# Patient Record
Sex: Female | Born: 1990 | Race: White | Hispanic: No | Marital: Single | Smoking: Never smoker
Health system: Southern US, Community
[De-identification: ages and names within clinical notes are randomized; demographics above are authoritative.]

## PROBLEM LIST (undated history)

## (undated) DIAGNOSIS — J309 Allergic rhinitis, unspecified: Secondary | ICD-10-CM

## (undated) DIAGNOSIS — R0600 Dyspnea, unspecified: Secondary | ICD-10-CM

## (undated) HISTORY — PX: OTHER SURGICAL HISTORY: SHX169

## (undated) HISTORY — DX: Allergic rhinitis, unspecified: J30.9

## (undated) HISTORY — DX: Dyspnea, unspecified: R06.00

## (undated) HISTORY — PX: TONSILLECTOMY: SUR1361

---
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
## 2012-04-27 ENCOUNTER — Other Ambulatory Visit: Payer: Self-pay | Admitting: Obstetrics and Gynecology

## 2012-04-27 DIAGNOSIS — N63 Unspecified lump in unspecified breast: Secondary | ICD-10-CM

## 2012-04-29 ENCOUNTER — Ambulatory Visit
Admission: RE | Admit: 2012-04-29 | Discharge: 2012-04-29 | Disposition: A | Discharge status: Home / Self Care | Payer: BC Managed Care – PPO | Source: Ambulatory Visit | Attending: Obstetrics and Gynecology | Admitting: Obstetrics and Gynecology

## 2012-04-29 DIAGNOSIS — N63 Unspecified lump in unspecified breast: Secondary | ICD-10-CM

## 2012-09-11 ENCOUNTER — Other Ambulatory Visit: Payer: Self-pay | Admitting: Nurse Practitioner

## 2012-09-11 DIAGNOSIS — N63 Unspecified lump in unspecified breast: Secondary | ICD-10-CM

## 2012-09-22 ENCOUNTER — Other Ambulatory Visit: Payer: BC Managed Care – PPO

## 2012-09-25 ENCOUNTER — Ambulatory Visit
Admission: RE | Admit: 2012-09-25 | Discharge: 2012-09-25 | Disposition: A | Discharge status: Home / Self Care | Payer: BC Managed Care – PPO | Source: Ambulatory Visit | Attending: Nurse Practitioner | Admitting: Nurse Practitioner

## 2012-09-25 DIAGNOSIS — N63 Unspecified lump in unspecified breast: Secondary | ICD-10-CM

## 2013-05-04 ENCOUNTER — Other Ambulatory Visit (HOSPITAL_COMMUNITY)
Admission: RE | Admit: 2013-05-04 | Discharge: 2013-05-04 | Disposition: A | Discharge status: Home / Self Care | Payer: BC Managed Care – PPO | Source: Ambulatory Visit | Attending: Nurse Practitioner | Admitting: Nurse Practitioner

## 2013-05-04 ENCOUNTER — Other Ambulatory Visit: Payer: Self-pay | Admitting: Nurse Practitioner

## 2013-05-04 DIAGNOSIS — Z113 Encounter for screening for infections with a predominantly sexual mode of transmission: Secondary | ICD-10-CM | POA: Insufficient documentation

## 2013-05-04 DIAGNOSIS — Z01419 Encounter for gynecological examination (general) (routine) without abnormal findings: Secondary | ICD-10-CM | POA: Insufficient documentation

## 2013-09-28 ENCOUNTER — Other Ambulatory Visit: Payer: Self-pay | Admitting: Nurse Practitioner

## 2013-09-28 DIAGNOSIS — D242 Benign neoplasm of left breast: Secondary | ICD-10-CM

## 2013-10-07 ENCOUNTER — Ambulatory Visit
Admission: RE | Admit: 2013-10-07 | Discharge: 2013-10-07 | Disposition: A | Discharge status: Home / Self Care | Payer: BC Managed Care – PPO | Source: Ambulatory Visit | Attending: Nurse Practitioner | Admitting: Nurse Practitioner

## 2013-10-07 DIAGNOSIS — D242 Benign neoplasm of left breast: Secondary | ICD-10-CM

## 2013-10-19 ENCOUNTER — Other Ambulatory Visit: Payer: Self-pay | Admitting: Family Medicine

## 2013-10-19 ENCOUNTER — Ambulatory Visit
Admission: RE | Admit: 2013-10-19 | Discharge: 2013-10-19 | Disposition: A | Discharge status: Home / Self Care | Payer: BC Managed Care – PPO | Source: Ambulatory Visit | Attending: Family Medicine | Admitting: Family Medicine

## 2013-10-19 DIAGNOSIS — J069 Acute upper respiratory infection, unspecified: Secondary | ICD-10-CM

## 2016-06-06 ENCOUNTER — Other Ambulatory Visit (HOSPITAL_COMMUNITY)
Admission: RE | Admit: 2016-06-06 | Discharge: 2016-06-06 | Disposition: A | Discharge status: Home / Self Care | Payer: BLUE CROSS/BLUE SHIELD | Source: Ambulatory Visit | Attending: Nurse Practitioner | Admitting: Nurse Practitioner

## 2016-06-06 ENCOUNTER — Other Ambulatory Visit: Payer: Self-pay | Admitting: Nurse Practitioner

## 2016-06-06 DIAGNOSIS — Z124 Encounter for screening for malignant neoplasm of cervix: Secondary | ICD-10-CM | POA: Diagnosis not present

## 2016-06-07 LAB — CYTOLOGY - PAP
Chlamydia: NEGATIVE
Diagnosis: NEGATIVE
Neisseria Gonorrhea: NEGATIVE

## 2018-01-02 ENCOUNTER — Other Ambulatory Visit: Payer: Self-pay

## 2018-01-02 ENCOUNTER — Emergency Department (HOSPITAL_COMMUNITY): Payer: Managed Care, Other (non HMO)

## 2018-01-02 ENCOUNTER — Ambulatory Visit (HOSPITAL_COMMUNITY): Payer: Managed Care, Other (non HMO)

## 2018-01-02 ENCOUNTER — Emergency Department (HOSPITAL_COMMUNITY)
Admission: EM | Admit: 2018-01-02 | Discharge: 2018-01-02 | Disposition: A | Discharge status: Home / Self Care | Payer: Managed Care, Other (non HMO) | Attending: Emergency Medicine | Admitting: Emergency Medicine

## 2018-01-02 ENCOUNTER — Encounter (HOSPITAL_COMMUNITY): Payer: Self-pay | Admitting: *Deleted

## 2018-01-02 DIAGNOSIS — K529 Noninfective gastroenteritis and colitis, unspecified: Secondary | ICD-10-CM | POA: Insufficient documentation

## 2018-01-02 DIAGNOSIS — Z79899 Other long term (current) drug therapy: Secondary | ICD-10-CM | POA: Insufficient documentation

## 2018-01-02 DIAGNOSIS — R1011 Right upper quadrant pain: Secondary | ICD-10-CM

## 2018-01-02 DIAGNOSIS — K6389 Other specified diseases of intestine: Secondary | ICD-10-CM

## 2018-01-02 LAB — URINALYSIS, ROUTINE W REFLEX MICROSCOPIC
Bilirubin Urine: NEGATIVE
Glucose, UA: NEGATIVE mg/dL
Hgb urine dipstick: NEGATIVE
Ketones, ur: NEGATIVE mg/dL
Nitrite: NEGATIVE
PH: 6 (ref 5.0–8.0)
Protein, ur: NEGATIVE mg/dL
SPECIFIC GRAVITY, URINE: 1.013 (ref 1.005–1.030)

## 2018-01-02 LAB — COMPREHENSIVE METABOLIC PANEL
ALK PHOS: 78 U/L (ref 38–126)
ALT: 21 U/L (ref 0–44)
AST: 15 U/L (ref 15–41)
Albumin: 3.3 g/dL — ABNORMAL LOW (ref 3.5–5.0)
Anion gap: 13 (ref 5–15)
BUN: 14 mg/dL (ref 6–20)
CHLORIDE: 106 mmol/L (ref 98–111)
CO2: 19 mmol/L — AB (ref 22–32)
CREATININE: 0.67 mg/dL (ref 0.44–1.00)
Calcium: 8.9 mg/dL (ref 8.9–10.3)
GFR calc Af Amer: >60 mL/min (ref >60)
Glucose, Bld: 103 mg/dL — ABNORMAL HIGH (ref 70–99)
Potassium: 4 mmol/L (ref 3.5–5.1)
Sodium: 138 mmol/L (ref 135–145)
Total Bilirubin: 0.3 mg/dL (ref 0.3–1.2)
Total Protein: 6.8 g/dL (ref 6.5–8.1)

## 2018-01-02 LAB — CBC
HEMATOCRIT: 38.9 % (ref 36.0–46.0)
Hemoglobin: 12.3 g/dL (ref 12.0–15.0)
MCH: 25.6 pg — ABNORMAL LOW (ref 26.0–34.0)
MCHC: 31.6 g/dL (ref 30.0–36.0)
MCV: 81 fL (ref 80.0–100.0)
Platelets: 378 10*3/uL (ref 150–400)
RBC: 4.8 MIL/uL (ref 3.87–5.11)
RDW: 13.6 % (ref 11.5–15.5)
WBC: 15.4 10*3/uL — ABNORMAL HIGH (ref 4.0–10.5)
nRBC: 0 % (ref 0.0–0.2)

## 2018-01-02 LAB — LIPASE, BLOOD: LIPASE: 29 U/L (ref 11–51)

## 2018-01-02 LAB — I-STAT BETA HCG BLOOD, ED (MC, WL, AP ONLY)

## 2018-01-02 MED ORDER — ONDANSETRON HCL 4 MG/2ML IJ SOLN
4.0000 mg | Freq: Once | INTRAMUSCULAR | Status: AC
  Administered 2018-01-02: 4 mg via INTRAVENOUS
  Filled 2018-01-02: qty 2

## 2018-01-02 MED ORDER — IOHEXOL 300 MG/ML  SOLN
100.0000 mL | Freq: Once | INTRAMUSCULAR | Status: AC | PRN
  Administered 2018-01-02: 100 mL via INTRAVENOUS

## 2018-01-02 MED ORDER — MORPHINE SULFATE (PF) 4 MG/ML IV SOLN
4.0000 mg | Freq: Once | INTRAVENOUS | Status: AC
  Administered 2018-01-02: 4 mg via INTRAVENOUS
  Filled 2018-01-02: qty 1

## 2018-01-02 MED ORDER — ONDANSETRON HCL 4 MG PO TABS: 4.0000 mg | ORAL_TABLET | Freq: Four times a day (QID) | ORAL | 0 refills | Status: AC

## 2018-01-02 MED ORDER — MORPHINE SULFATE (PF) 4 MG/ML IV SOLN
4.0000 mg | Freq: Once | INTRAVENOUS | Status: AC
Start: 2018-01-02 — End: 2018-01-02
  Administered 2018-01-02: 4 mg via INTRAVENOUS
  Filled 2018-01-02: qty 1

## 2018-01-02 MED ORDER — IBUPROFEN 600 MG PO TABS: 600.0000 mg | ORAL_TABLET | Freq: Four times a day (QID) | ORAL | 0 refills | Status: AC | PRN

## 2018-01-02 MED ORDER — ACETAMINOPHEN 500 MG PO TABS: 500.0000 mg | ORAL_TABLET | Freq: Four times a day (QID) | ORAL | 0 refills | Status: AC | PRN

## 2018-01-02 NOTE — ED Provider Notes (Signed)
[facility] EMERGENCY DEPARTMENT Provider Note   CSN: [id number] Arrival date & time: 01/02/18  1607     History   Chief Complaint Chief Complaint  Patient presents with  . Abdominal Pain    HPI Kathryn Pacheco is a 27 y.o. female who presents with a 2-day history of right upper quadrant pain.  Patient reports that first she may have a just eaten too much food at Christmas, but as the day went on, she developed worsening pain to her right upper quadrant.  It radiates to her back.  She has had 1 or 2 episodes of emesis.  She denies any fevers.  She reports she has noticed she has been urinating more frequently the past day.  She denies any abnormal vaginal bleeding or discharge.  Patient reports her right upper quadrant hurts when she breathes, but she denies any chest pain, shortness of breath.  HPI  Past Medical History:  Diagnosis Date  . Allergic rhinitis   . Dyspnea     Patient Active Problem List   Diagnosis Date Noted  . Dyspnea 07/10/2011    Past Surgical History:  Procedure Laterality Date  . tibia surgery     tumor     OB History   No obstetric history on file.      Home Medications    Prior to Admission medications   Medication Sig Start Date End Date Taking? Authorizing Provider  aspirin-acetaminophen-caffeine (EXCEDRIN MIGRAINE) [phone] MG tablet Take 2 tablets by mouth every 6 (six) hours as needed for headache.   Yes [provider]  desogestrel-ethinyl estradiol (ISIBLOOM) 0.15-30 MG-MCG tablet Take 1 tablet by mouth daily.   Yes [provider]  escitalopram (LEXAPRO) 10 MG tablet Take 10 mg by mouth daily.   Yes [provider]  omeprazole (PRILOSEC) 20 MG capsule Take 20 mg by mouth daily as needed (GERD).   Yes [provider]  acetaminophen (TYLENOL) 500 MG tablet Take 1 tablet (500 mg total) by mouth every 6 (six) hours as needed. 01/02/18   Tylee Yum, Bea Graff, PA-C  ibuprofen  (ADVIL,MOTRIN) 600 MG tablet Take 1 tablet (600 mg total) by mouth every 6 (six) hours as needed. 01/02/18   Lissie Hinesley, Bea Graff, PA-C  ondansetron (ZOFRAN) 4 MG tablet Take 1 tablet (4 mg total) by mouth every 6 (six) hours. 01/02/18   Frederica Kuster, PA-C    Family History No family history on file.  Social History Social History   Tobacco Use  . Smoking status: Never Smoker  . Smokeless tobacco: Never Used  Substance Use Topics  . Alcohol use: Yes  . Drug use: No     Allergies   Patient has no known allergies.   Review of Systems Review of Systems  Constitutional: Negative for chills and fever.  HENT: Negative for facial swelling and sore throat.   Respiratory: Negative for shortness of breath.   Cardiovascular: Negative for chest pain.  Gastrointestinal: Positive for abdominal pain, nausea and vomiting. Negative for blood in stool and diarrhea.  Genitourinary: Positive for frequency. Negative for dysuria.  Musculoskeletal: Negative for back pain.  Skin: Negative for rash and wound.  Neurological: Negative for headaches.  Psychiatric/Behavioral: The patient is not nervous/anxious.      Physical Exam Updated Vital Signs BP 125/72   Pulse 80   Temp 98.2 F (36.8 C) (Oral)   Resp 16   Ht 5\' 2"  (1.575 m)   Wt 83.9 kg   SpO2  95%   BMI 33.84 kg/m   Physical Exam Vitals signs and nursing note reviewed.  Constitutional:      General: She is not in acute distress.    Appearance: She is well-developed. She is not diaphoretic.  HENT:     Head: Normocephalic and atraumatic.     Mouth/Throat:     Pharynx: No oropharyngeal exudate.  Eyes:     General: No scleral icterus.       Right eye: No discharge.        Left eye: No discharge.     Conjunctiva/sclera: Conjunctivae normal.     Pupils: Pupils are equal, round, and reactive to light.  Neck:     Musculoskeletal: Normal range of motion and neck supple.     Thyroid: No thyromegaly.  Cardiovascular:     Rate and  Rhythm: Normal rate and regular rhythm.     Heart sounds: Normal heart sounds. No murmur. No friction rub. No gallop.   Pulmonary:     Effort: Pulmonary effort is normal. No respiratory distress.     Breath sounds: Normal breath sounds. No stridor. No wheezing or rales.  Abdominal:     General: Bowel sounds are normal. There is no distension.     Palpations: Abdomen is soft.     Tenderness: There is abdominal tenderness in the right upper quadrant. There is right CVA tenderness. There is no guarding or rebound. Positive signs include Murphy's sign. Negative signs include McBurney's sign.  Lymphadenopathy:     Cervical: No cervical adenopathy.  Skin:    General: Skin is warm and dry.     Coloration: Skin is not pale.     Findings: No rash.  Neurological:     Mental Status: She is alert.     Coordination: Coordination normal.      ED Treatments / Results  Labs (all labs ordered are listed, but only abnormal results are displayed) Labs Reviewed  COMPREHENSIVE METABOLIC PANEL - Abnormal; Notable for the following components:      Result Value   CO2 19 (*)    Glucose, Bld 103 (*)    Albumin 3.3 (*)    All other components within normal limits  CBC - Abnormal; Notable for the following components:   WBC 15.4 (*)    MCH 25.6 (*)    All other components within normal limits  URINALYSIS, ROUTINE W REFLEX MICROSCOPIC - Abnormal; Notable for the following components:   Leukocytes, UA TRACE (*)    Bacteria, UA FEW (*)    All other components within normal limits  URINE CULTURE  LIPASE, BLOOD  I-STAT BETA HCG BLOOD, ED ([facility], [facility], [facility] ONLY)    EKG None  Radiology Ct Abdomen Pelvis W Contrast  Result Date: 01/02/2018 CLINICAL DATA:  27 year old female with acute RIGHT abdominal pain and nausea for 2 days. EXAM: CT ABDOMEN AND PELVIS WITH CONTRAST TECHNIQUE: Multidetector CT imaging of the abdomen and pelvis was performed using the standard protocol following bolus administration of  intravenous contrast. CONTRAST:  133mL OMNIPAQUE IOHEXOL 300 MG/ML  SOLN COMPARISON:  None. FINDINGS: Lower chest: No acute abnormality. Hepatobiliary: The liver and gallbladder are unremarkable. No biliary dilatation. Pancreas: Unremarkable Spleen: Unremarkable Adrenals/Urinary Tract: The kidneys, adrenal glands and bladder are unremarkable except for a punctate nonobstructing LEFT renal calculus. Stomach/Bowel: Stranding/inflammation within the anterior abdomen below the transverse colon compatible with either epiploic appendagitis or small omental infarct. No bowel obstruction, bowel dilatation, bowel wall thickening or other inflammatory changes.  The appendix is normal. Vascular/Lymphatic: No significant vascular findings are present. No enlarged abdominal or pelvic lymph nodes. Reproductive: Uterus and bilateral adnexa are unremarkable. Other: No ascites, focal collection or pneumoperitoneum. Musculoskeletal: No acute or suspicious bony abnormalities are identified. IMPRESSION: 1. Stranding/inflammation within the anterior abdomen compatible with either epiploic appendagitis or small omental infarct. 2. Punctate nonobstructing LEFT renal calculus. Electronically Signed   By: Margarette Canada M.D.   On: 01/02/2018 14:22   US Abdomen Limited Ruq  Result Date: 01/02/2018 CLINICAL DATA:  Right upper abdominal pain x1 day EXAM: ULTRASOUND ABDOMEN LIMITED RIGHT UPPER QUADRANT COMPARISON:  None. FINDINGS: Gallbladder: No gallstones or wall thickening visualized. No sonographic Murphy sign noted by sonographer. Common bile duct: Diameter: 2.8 mm, unremarkable Liver: No focal lesion identified. Within normal limits in parenchymal echogenicity. Portal vein is patent on color Doppler imaging with normal direction of blood flow towards the liver. IMPRESSION: Negative Electronically Signed   By: Lucrezia Europe M.D.   On: 01/02/2018 10:52    Procedures Procedures (including critical care time)  Medications Ordered in  ED Medications  morphine 4 MG/ML injection 4 mg (4 mg Intravenous Given 01/02/18 0859)  ondansetron (ZOFRAN) injection 4 mg (4 mg Intravenous Given 01/02/18 0859)  ondansetron (ZOFRAN) injection 4 mg (4 mg Intravenous Given 01/02/18 1340)  morphine 4 MG/ML injection 4 mg (4 mg Intravenous Given 01/02/18 1340)  iohexol (OMNIPAQUE) 300 MG/ML solution 100 mL (100 mLs Intravenous Contrast Given 01/02/18 1342)     Initial Impression / Assessment and Plan / ED Course  I have reviewed the triage vital signs and the nursing notes.  Pertinent labs & imaging results that were available during my care of the patient were reviewed by me and considered in my medical decision making (see chart for details).  Clinical Course as of Jan 03 1511  Fri Jan 02, 2018  1130 Patient initially with right upper quadrant pain.  Right upper quadrant ultrasound is negative.  Patient has leukocytosis and on repeat abdominal exam, patient's pain is migrating to the periumbilical and right lower quadrant region.  CT ordered to rule out appendicitis.   [AL]    Clinical Course User Index [AL] Frederica Kuster, PA-C    Patient presenting with initially right upper quadrant pain, however throughout ED course patient developed periumbilical and right lower quadrant pain.  Initial right upper quadrant ultrasound was negative.  Considering ongoing symptoms and leukocytosis, CT abdomen pelvis was ordered to rule out appendicitis.  CT returned with evidence of epiploic appendagitis or small omental infarct.  Patient's pain controlled with morphine and nausea controlled with Zofran in the ED.  I discussed patient case with Claiborne Billings with general surgery, who advised NSAIDs and follow-up to PCP as needed.  There is no surgical intervention for this finding.  Patient and father updated on plan and they are agreeable.  Return precautions discussed.  Patient understands and agrees with plan.  Patient vital stable throughout ED course and  discharged in satisfactory condition.  Final Clinical Impressions(s) / ED Diagnoses   Final diagnoses:  RUQ pain  Epiploic appendagitis    ED Discharge Orders         Ordered    ibuprofen (ADVIL,MOTRIN) 600 MG tablet  Every 6 hours PRN     01/02/18 1505    acetaminophen (TYLENOL) 500 MG tablet  Every 6 hours PRN     01/02/18 1505    ondansetron (ZOFRAN) 4 MG tablet  Every 6 hours  01/02/18 1505           Frederica Kuster, PA-C 01/02/18 1512    Mesner, Corene Cornea, MD 01/02/18 1524

## 2018-01-02 NOTE — ED Notes (Signed)
Pt. Requesting something to drink.

## 2018-01-02 NOTE — ED Notes (Signed)
Pt to ultrasound

## 2018-01-02 NOTE — Discharge Instructions (Signed)
Take ibuprofen every 6 hours as prescribed for your pain.  Take Tylenol alternating as needed as well.  Take Zofran every 6 hours as needed for nausea or vomiting.  Please follow-up with your doctor is 3 to 4 days if her symptoms are not improving.

## 2018-01-02 NOTE — ED Triage Notes (Signed)
States she was having right sided abd. Pain yest and after eating last pm vomiting and the pain is more severe, states the pain is also in her. Back.

## 2018-01-02 NOTE — ED Notes (Signed)
Patient transported to Ultrasound 

## 2018-01-02 NOTE — ED Notes (Signed)
Patient transported to CT 

## 2018-01-03 LAB — URINE CULTURE: Culture: NO GROWTH

## 2018-03-19 ENCOUNTER — Encounter (INDEPENDENT_AMBULATORY_CARE_PROVIDER_SITE_OTHER): Payer: Self-pay | Admitting: Surgery

## 2018-03-19 ENCOUNTER — Other Ambulatory Visit: Payer: Self-pay

## 2018-03-19 ENCOUNTER — Ambulatory Visit (INDEPENDENT_AMBULATORY_CARE_PROVIDER_SITE_OTHER): Payer: Self-pay

## 2018-03-19 ENCOUNTER — Ambulatory Visit (INDEPENDENT_AMBULATORY_CARE_PROVIDER_SITE_OTHER): Payer: 59 | Admitting: Surgery

## 2018-03-19 DIAGNOSIS — M25572 Pain in left ankle and joints of left foot: Secondary | ICD-10-CM | POA: Diagnosis not present

## 2018-03-19 NOTE — Progress Notes (Signed)
Office Visit Note   Patient: Kathryn Pacheco           Date of Birth: Jan 28, 1990           MRN: [id number] Visit Date: 03/19/2018              Requested by: Baruch Goldmann, PA-C [address], [place] [address] PCP: Baruch Goldmann, PA-C   Assessment & Plan: Visit Diagnoses:  1. Pain in left ankle and joints of left foot   2. Left lateral ankle pain     Plan: Patient will continue the cam boot.  Weight-bear as tolerated.  Follow-up with me in 2 weeks for recheck.  If she continues to improve I may put her in a ASO at that time.  Can use ice off and on as needed. Follow-Up Instructions: Return in about 2 weeks (around 04/02/2018) for with james recheck ankle sprain.   Orders:  Orders Placed This Encounter  Procedures  . XR Ankle Complete Left   No orders of the defined types were placed in this encounter.     Procedures: No procedures performed   Clinical Data: No additional findings.   Subjective: Chief Complaint  Patient presents with  . Left Foot - Pain  . Left Ankle - Pain    HPI 28 year old white female comes in today with complaints of left lateral ankle pain and foot pain.  States that 1 week ago she was sitting on the floor and when she went to go stand up she felt something pop in her lateral ankle and had pain.  Shortly after that she went to her dad's house and was going up some steps when she again lost her footing and may have rolled her ankle.  She did have pain with weightbearing.  [facility] and had x-rays and advised that these were negative for fracture.  Diagnosed with an ankle sprain and put in a fracture boot.  She has been weightbearing in the boot.  No previous problems with her foot or ankle before injury. Review of Systems No current cardiac pulmonary GI GU issues Objective: Vital Signs: There were no vitals taken for this visit.  Physical Exam HENT:     Head: Normocephalic and  atraumatic.     Mouth/Throat:     Mouth: Mucous membranes are dry.  Eyes:     Extraocular Movements: Extraocular movements intact.     Pupils: Pupils are equal, round, and reactive to light.  Pulmonary:     Effort: No respiratory distress.  Musculoskeletal:     Comments: Gait is somewhat antalgic.  Exam left foot and ankle shows minimal swelling.  No bruising.  No redness.  She is mild to moderately tender over the ATFL.  Ankle ligaments feel stable.  Skin:    General: Skin is warm and dry.  Neurological:     General: No focal deficit present.     Mental Status: She is alert and oriented to person, place, and time.  Psychiatric:        Mood and Affect: Mood normal.     Ortho Exam  Specialty Comments:  No specialty comments available.  Imaging: No results found.   PMFS History: Patient Active Problem List   Diagnosis Date Noted  . Dyspnea 07/10/2011   Past Medical History:  Diagnosis Date  . Allergic rhinitis   . Dyspnea     History reviewed. No pertinent family  history.  Past Surgical History:  Procedure Laterality Date  . tibia surgery     tumor   Social History   Occupational History  . Not on file  Tobacco Use  . Smoking status: Never Smoker  . Smokeless tobacco: Never Used  Substance and Sexual Activity  . Alcohol use: Yes  . Drug use: No  . Sexual activity: Not on file

## 2018-03-31 ENCOUNTER — Telehealth (INDEPENDENT_AMBULATORY_CARE_PROVIDER_SITE_OTHER): Payer: Self-pay | Admitting: Radiology

## 2018-03-31 NOTE — Telephone Encounter (Signed)
I called and advised that she going to be seeing Dr. Louanne Skye on 04/02/2018 @ 145 and she answered NO to all the COVID-19 questions

## 2018-04-02 ENCOUNTER — Encounter (INDEPENDENT_AMBULATORY_CARE_PROVIDER_SITE_OTHER): Payer: Self-pay | Admitting: Specialist

## 2018-04-02 ENCOUNTER — Other Ambulatory Visit: Payer: Self-pay

## 2018-04-02 ENCOUNTER — Ambulatory Visit (INDEPENDENT_AMBULATORY_CARE_PROVIDER_SITE_OTHER): Payer: 59 | Admitting: Specialist

## 2018-04-02 VITALS — BP 116/74 | HR 85 | Ht 63.0 in | Wt 190.0 lb

## 2018-04-02 DIAGNOSIS — M25572 Pain in left ankle and joints of left foot: Secondary | ICD-10-CM

## 2018-04-02 NOTE — Progress Notes (Signed)
28 year old white female who is about 4 weeks out from left lateral ankle sprain returns for recheck.  States that she is doing well.  Lateral ankle soreness has decreased.  Has been compliant with wearing boot.  Exam Very pleasant white female alert and oriented in no acute distress.  She is ambulating very well.  Can do heel raise with minimal discomfort.  Left ankle good range of motion.  No swelling.  No bruising.  Ligaments are stable.  She is definitely less tender over the ATFL.   Plan Patient was given a ASO brace today she will gradually wean out of this over the next couple weeks.  Given samples of Aleve 2 tablets p.o. twice daily as needed with food.  Follow-up in 4 weeks for recheck.  If she is doing well not have any issues he may cancel that appointment.  I also gave patient some strengthening exercises that she can begin.

## 2018-04-30 ENCOUNTER — Ambulatory Visit (INDEPENDENT_AMBULATORY_CARE_PROVIDER_SITE_OTHER): Payer: 59 | Admitting: Specialist

## 2018-07-09 ENCOUNTER — Other Ambulatory Visit (HOSPITAL_COMMUNITY)
Admission: RE | Admit: 2018-07-09 | Discharge: 2018-07-09 | Disposition: A | Discharge status: Home / Self Care | Payer: Medicaid Other | Source: Ambulatory Visit | Attending: Obstetrics and Gynecology | Admitting: Obstetrics and Gynecology

## 2018-07-09 ENCOUNTER — Other Ambulatory Visit: Payer: Self-pay | Admitting: Nurse Practitioner

## 2018-07-09 DIAGNOSIS — Z01419 Encounter for gynecological examination (general) (routine) without abnormal findings: Secondary | ICD-10-CM | POA: Insufficient documentation

## 2018-07-14 LAB — CYTOLOGY - PAP: Diagnosis: NEGATIVE

## 2020-07-21 ENCOUNTER — Other Ambulatory Visit: Payer: Self-pay | Admitting: Gastroenterology

## 2020-07-21 DIAGNOSIS — R110 Nausea: Secondary | ICD-10-CM

## 2020-07-21 DIAGNOSIS — R109 Unspecified abdominal pain: Secondary | ICD-10-CM

## 2020-07-28 ENCOUNTER — Ambulatory Visit
Admission: RE | Admit: 2020-07-28 | Discharge: 2020-07-28 | Disposition: A | Discharge status: Home / Self Care | Payer: 59 | Source: Ambulatory Visit | Attending: Gastroenterology | Admitting: Gastroenterology

## 2020-07-28 DIAGNOSIS — R109 Unspecified abdominal pain: Secondary | ICD-10-CM

## 2020-07-28 DIAGNOSIS — R110 Nausea: Secondary | ICD-10-CM

## 2020-07-31 ENCOUNTER — Other Ambulatory Visit: Payer: Self-pay | Admitting: Gastroenterology

## 2020-07-31 DIAGNOSIS — R9389 Abnormal findings on diagnostic imaging of other specified body structures: Secondary | ICD-10-CM

## 2020-08-02 ENCOUNTER — Other Ambulatory Visit (HOSPITAL_COMMUNITY): Payer: Self-pay | Admitting: Gastroenterology

## 2020-08-02 ENCOUNTER — Other Ambulatory Visit: Payer: Self-pay | Admitting: Gastroenterology

## 2020-08-02 DIAGNOSIS — R1011 Right upper quadrant pain: Secondary | ICD-10-CM

## 2020-08-06 ENCOUNTER — Ambulatory Visit
Admission: RE | Admit: 2020-08-06 | Discharge: 2020-08-06 | Disposition: A | Discharge status: Home / Self Care | Payer: 59 | Source: Ambulatory Visit | Attending: Gastroenterology | Admitting: Gastroenterology

## 2020-08-06 ENCOUNTER — Other Ambulatory Visit: Payer: Self-pay

## 2020-08-06 DIAGNOSIS — R9389 Abnormal findings on diagnostic imaging of other specified body structures: Secondary | ICD-10-CM

## 2020-08-06 MED ORDER — GADOBENATE DIMEGLUMINE 529 MG/ML IV SOLN: 15.0000 mL | Freq: Once | INTRAVENOUS | Status: DC | PRN

## 2020-08-06 MED ORDER — GADOBENATE DIMEGLUMINE 529 MG/ML IV SOLN
18.0000 mL | Freq: Once | INTRAVENOUS | Status: AC | PRN
  Administered 2020-08-06: 18 mL via INTRAVENOUS

## 2020-08-08 ENCOUNTER — Other Ambulatory Visit: Payer: Self-pay | Admitting: Gastroenterology

## 2020-08-08 DIAGNOSIS — R935 Abnormal findings on diagnostic imaging of other abdominal regions, including retroperitoneum: Secondary | ICD-10-CM

## 2020-08-09 ENCOUNTER — Other Ambulatory Visit: Payer: Self-pay

## 2020-08-09 ENCOUNTER — Ambulatory Visit
Admission: RE | Admit: 2020-08-09 | Discharge: 2020-08-09 | Disposition: A | Discharge status: Home / Self Care | Payer: 59 | Source: Ambulatory Visit | Attending: Gastroenterology | Admitting: Gastroenterology

## 2020-08-09 DIAGNOSIS — R935 Abnormal findings on diagnostic imaging of other abdominal regions, including retroperitoneum: Secondary | ICD-10-CM

## 2020-08-09 MED ORDER — GADOXETATE DISODIUM 0.25 MMOL/ML IV SOLN
7.0000 mL | Freq: Once | INTRAVENOUS | Status: AC | PRN
  Administered 2020-08-09: 7 mL via INTRAVENOUS

## 2020-08-15 ENCOUNTER — Ambulatory Visit (HOSPITAL_COMMUNITY)
Admission: RE | Admit: 2020-08-15 | Discharge: 2020-08-15 | Disposition: A | Discharge status: Home / Self Care | Payer: 59 | Source: Ambulatory Visit | Attending: Gastroenterology | Admitting: Gastroenterology

## 2020-08-15 ENCOUNTER — Other Ambulatory Visit: Payer: Self-pay

## 2020-08-15 DIAGNOSIS — R1011 Right upper quadrant pain: Secondary | ICD-10-CM | POA: Insufficient documentation

## 2020-08-19 ENCOUNTER — Other Ambulatory Visit: Payer: 59

## 2021-05-09 ENCOUNTER — Other Ambulatory Visit: Payer: Self-pay | Admitting: Physician Assistant

## 2021-05-09 DIAGNOSIS — R160 Hepatomegaly, not elsewhere classified: Secondary | ICD-10-CM

## 2021-05-09 DIAGNOSIS — K7689 Other specified diseases of liver: Secondary | ICD-10-CM

## 2021-05-16 ENCOUNTER — Ambulatory Visit
Admission: RE | Admit: 2021-05-16 | Discharge: 2021-05-16 | Disposition: A | Discharge status: Home / Self Care | Payer: 59 | Source: Ambulatory Visit | Attending: Physician Assistant | Admitting: Physician Assistant

## 2021-05-16 DIAGNOSIS — R160 Hepatomegaly, not elsewhere classified: Secondary | ICD-10-CM

## 2021-05-16 DIAGNOSIS — K7689 Other specified diseases of liver: Secondary | ICD-10-CM

## 2021-05-16 MED ORDER — GADOXETATE DISODIUM 0.25 MMOL/ML IV SOLN
9.0000 mL | Freq: Once | INTRAVENOUS | Status: AC
  Administered 2021-05-16: 9 mL via INTRAVENOUS

## 2021-05-20 ENCOUNTER — Other Ambulatory Visit: Payer: 59

## 2021-05-26 ENCOUNTER — Other Ambulatory Visit: Payer: 59

## 2021-09-06 ENCOUNTER — Other Ambulatory Visit: Payer: Self-pay | Admitting: Nurse Practitioner

## 2021-09-06 ENCOUNTER — Other Ambulatory Visit (HOSPITAL_COMMUNITY)
Admission: RE | Admit: 2021-09-06 | Discharge: 2021-09-06 | Disposition: A | Discharge status: Home / Self Care | Payer: 59 | Source: Ambulatory Visit | Attending: Nurse Practitioner | Admitting: Nurse Practitioner

## 2021-09-06 DIAGNOSIS — Z124 Encounter for screening for malignant neoplasm of cervix: Secondary | ICD-10-CM | POA: Insufficient documentation

## 2021-09-13 LAB — CYTOLOGY - PAP: Diagnosis: NEGATIVE

## 2023-11-25 ENCOUNTER — Emergency Department (HOSPITAL_BASED_OUTPATIENT_CLINIC_OR_DEPARTMENT_OTHER)

## 2023-11-25 ENCOUNTER — Encounter (HOSPITAL_BASED_OUTPATIENT_CLINIC_OR_DEPARTMENT_OTHER): Payer: Self-pay | Admitting: Emergency Medicine

## 2023-11-25 ENCOUNTER — Other Ambulatory Visit: Payer: Self-pay

## 2023-11-25 ENCOUNTER — Emergency Department (HOSPITAL_BASED_OUTPATIENT_CLINIC_OR_DEPARTMENT_OTHER)
Admission: EM | Admit: 2023-11-25 | Discharge: 2023-11-25 | Disposition: A | Discharge status: Home / Self Care | Attending: Emergency Medicine | Admitting: Emergency Medicine

## 2023-11-25 DIAGNOSIS — Z7982 Long term (current) use of aspirin: Secondary | ICD-10-CM | POA: Insufficient documentation

## 2023-11-25 DIAGNOSIS — N200 Calculus of kidney: Secondary | ICD-10-CM | POA: Insufficient documentation

## 2023-11-25 DIAGNOSIS — R1012 Left upper quadrant pain: Secondary | ICD-10-CM | POA: Diagnosis present

## 2023-11-25 LAB — COMPREHENSIVE METABOLIC PANEL WITH GFR
ALT: 10 U/L (ref 0–44)
AST: 15 U/L (ref 15–41)
Albumin: 4.3 g/dL (ref 3.5–5.0)
Alkaline Phosphatase: 71 U/L (ref 38–126)
Anion gap: 11 (ref 5–15)
BUN: 17 mg/dL (ref 6–20)
CO2: 25 mmol/L (ref 22–32)
Calcium: 10 mg/dL (ref 8.9–10.3)
Chloride: 103 mmol/L (ref 98–111)
Creatinine, Ser: 0.76 mg/dL (ref 0.44–1.00)
GFR, Estimated: >60 mL/min (ref >60)
Glucose, Bld: 91 mg/dL (ref 70–99)
Potassium: 4.2 mmol/L (ref 3.5–5.1)
Sodium: 139 mmol/L (ref 135–145)
Total Bilirubin: 0.3 mg/dL (ref 0.0–1.2)
Total Protein: 7.4 g/dL (ref 6.5–8.1)

## 2023-11-25 LAB — CBC
HCT: 39.7 % (ref 36.0–46.0)
Hemoglobin: 13.4 g/dL (ref 12.0–15.0)
MCH: 28.8 pg (ref 26.0–34.0)
MCHC: 33.8 g/dL (ref 30.0–36.0)
MCV: 85.2 fL (ref 80.0–100.0)
Platelets: 363 K/uL (ref 150–400)
RBC: 4.66 MIL/uL (ref 3.87–5.11)
RDW: 12.8 % (ref 11.5–15.5)
WBC: 11.2 K/uL — ABNORMAL HIGH (ref 4.0–10.5)
nRBC: 0 % (ref 0.0–0.2)

## 2023-11-25 LAB — URINALYSIS, ROUTINE W REFLEX MICROSCOPIC
Bacteria, UA: NONE SEEN
Bilirubin Urine: NEGATIVE
Glucose, UA: NEGATIVE mg/dL
Ketones, ur: NEGATIVE mg/dL
Leukocytes,Ua: NEGATIVE
Nitrite: NEGATIVE
Protein, ur: NEGATIVE mg/dL
Specific Gravity, Urine: 1.014 (ref 1.005–1.030)
pH: 6 (ref 5.0–8.0)

## 2023-11-25 LAB — PREGNANCY, URINE: Preg Test, Ur: NEGATIVE

## 2023-11-25 LAB — LIPASE, BLOOD: Lipase: 29 U/L (ref 11–51)

## 2023-11-25 MED ORDER — OXYCODONE-ACETAMINOPHEN 5-325 MG PO TABS: 1.0000 | ORAL_TABLET | Freq: Four times a day (QID) | ORAL | 0 refills | Status: AC | PRN

## 2023-11-25 MED ORDER — KETOROLAC TROMETHAMINE 15 MG/ML IJ SOLN
15.0000 mg | Freq: Once | INTRAMUSCULAR | Status: AC
  Administered 2023-11-25: 15 mg via INTRAVENOUS
  Filled 2023-11-25: qty 1

## 2023-11-25 MED ORDER — ONDANSETRON 4 MG PO TBDP: 4.0000 mg | ORAL_TABLET | Freq: Three times a day (TID) | ORAL | 0 refills | Status: AC | PRN

## 2023-11-25 MED ORDER — ONDANSETRON 4 MG PO TBDP
4.0000 mg | ORAL_TABLET | Freq: Once | ORAL | Status: AC
  Administered 2023-11-25: 4 mg via ORAL
  Filled 2023-11-25: qty 1

## 2023-11-25 NOTE — ED Triage Notes (Signed)
 Pt caox4 ambulatory c/o periumbilical abd pain and bladder spasms x1 wk. Had an episode of urine appearing to have blood in it last week. Reports fever this morning but states she has been taking motrin  regularly. Last took Ibuprofen  1730.

## 2023-11-25 NOTE — ED Notes (Signed)
 Pt d/c instructions, medications, and follow-up care reviewed with pt. Pt verbalized understanding and had no further questions at time of d/c. Pt CA&Ox4, ambulatory, and in NAD at time of d/c

## 2023-11-25 NOTE — ED Provider Notes (Signed)
 [place] [facility] AT [facility] Provider Note   CSN: [id number] Arrival date & time: 11/25/23  8187     Patient presents with: Abdominal Pain   Kathryn Pacheco is a 33 y.o. female here for evaluation abdominal pain.  Over the last week she has had pain to suprapubic region, left upper abdomen, left flank.  Feels like she is having bladder spasms.  No dysuria, urgency.  Has noted some hematuria over the last few days.  Taking Motrin .  No documented fever.  Some nausea without vomiting.  No personal history of kidney stones however does have family history of kidney stones.  No change in bowel movement.  No vaginal discharge or concern for STI.  No chest pain, shortness of breath, cough   HPI     Prior to Admission medications   Medication Sig Start Date End Date Taking? Authorizing Provider  ondansetron  (ZOFRAN -ODT) 4 MG disintegrating tablet Take 1 tablet (4 mg total) by mouth every 8 (eight) hours as needed. 11/25/23  Yes Lu Paradise A, PA-C  oxyCODONE-acetaminophen  (PERCOCET/ROXICET) 5-325 MG tablet Take 1 tablet by mouth every 6 (six) hours as needed for severe pain (pain score 7-10). 11/25/23  Yes Tarrin Menn A, PA-C  acetaminophen  (TYLENOL ) 500 MG tablet Take 1 tablet (500 mg total) by mouth every 6 (six) hours as needed. 01/02/18   Law, Alexandra M, PA-C  aspirin-acetaminophen -caffeine (EXCEDRIN MIGRAINE) 250-250-65 MG tablet Take 2 tablets by mouth every 6 (six) hours as needed for headache.    [provider]  desogestrel-ethinyl estradiol (ISIBLOOM) 0.15-30 MG-MCG tablet Take 1 tablet by mouth daily.    [provider]  escitalopram (LEXAPRO) 10 MG tablet Take 10 mg by mouth daily.    [provider]  ibuprofen  (ADVIL ,MOTRIN ) 600 MG tablet Take 1 tablet (600 mg total) by mouth every 6 (six) hours as needed. 01/02/18   Law, Lorane HERO, PA-C  omeprazole (PRILOSEC) 20 MG capsule Take 20 mg by mouth daily as needed (GERD).     [provider]  ondansetron  (ZOFRAN ) 4 MG tablet Take 1 tablet (4 mg total) by mouth every 6 (six) hours. 01/02/18   Rendell Lorane HERO, PA-C    Allergies: Prednisone    Review of Systems  Constitutional: Negative.   HENT: Negative.    Respiratory: Negative.    Cardiovascular: Negative.   Gastrointestinal:  Positive for abdominal pain and nausea. Negative for anal bleeding, blood in stool, constipation, diarrhea and vomiting.  Genitourinary:  Positive for hematuria.  Musculoskeletal: Negative.   Skin: Negative.   Neurological: Negative.   All other systems reviewed and are negative.   Updated Vital Signs BP 117/77   Pulse 92   Temp 98.4 F (36.9 C) (Oral)   Resp 16   SpO2 99%   Physical Exam Vitals and nursing note reviewed.  Constitutional:      General: She is not in acute distress.    Appearance: She is well-developed. She is not ill-appearing, toxic-appearing or diaphoretic.  HENT:     Head: Atraumatic.  Eyes:     Pupils: Pupils are equal, round, and reactive to light.  Cardiovascular:     Rate and Rhythm: Normal rate.  Pulmonary:     Effort: No respiratory distress.  Abdominal:     General: Bowel sounds are normal. There is no distension.     Palpations: Abdomen is soft.     Tenderness: There is abdominal tenderness in the suprapubic area and left lower quadrant. There  is no right CVA tenderness, left CVA tenderness, guarding or rebound. Negative signs include Murphy's sign.  Musculoskeletal:        General: Normal range of motion.     Cervical back: Normal range of motion.  Skin:    General: Skin is warm and dry.  Neurological:     General: No focal deficit present.     Mental Status: She is alert.  Psychiatric:        Mood and Affect: Mood normal.     (all labs ordered are listed, but only abnormal results are displayed) Labs Reviewed  CBC - Abnormal; Notable for the following components:      Result Value   WBC 11.2 (*)    All other  components within normal limits  URINALYSIS, ROUTINE W REFLEX MICROSCOPIC - Abnormal; Notable for the following components:   Hgb urine dipstick TRACE (*)    All other components within normal limits  LIPASE, BLOOD  COMPREHENSIVE METABOLIC PANEL WITH GFR  PREGNANCY, URINE    EKG: None  Radiology: US  PELVIC COMPLETE W TRANSVAGINAL AND TORSION R/O Result Date: 11/25/2023 EXAM: US  Pelvis, Complete Transvaginal and Transabdominal with Doppler 11/25/2023 10:09:00 PM TECHNIQUE: Transabdominal and transvaginal pelvic duplex ultrasound using B-mode/gray scaled imaging with Doppler spectral analysis and color flow was obtained. COMPARISON: None available CLINICAL HISTORY: Pelvic pain. FINDINGS: UTERUS: The uterus is anteverted. The cervix is unremarkable. No intrauterine masses are seen. The uterus measures 7.4 x 2.8 x 3.7 cm with a volume of 41 cc. Uterus demonstrates normal myometrial echotexture. ENDOMETRIAL STRIPE: Endometrium measures 4 mm in thickness and is uniform. Intrauterine device is in expected position within the endometrial cavity. RIGHT OVARY: No right adnexal or ovarian mass. The right ovary measures 2.8 x 1.8 x 2.2 cm with a volume of 6 cc. LEFT OVARY: No left adnexal or ovarian mass. The left ovary measures 3.3 x 1.4 x 2.1 cm with a volume of 6 cc. Duplex sonography demonstrates normal vascularity of the ovaries bilaterally. FREE FLUID: No free fluid. IMPRESSION: 1. Normal pelvic ultrasound with appropriately positioned intrauterine device and no adnexal masses detected Electronically signed by: Dorethia Molt MD 11/25/2023 10:30 PM EST RP Workstation: [id number]   CT Renal Stone Study Result Date: 11/25/2023 CLINICAL DATA:  Abdominal and flank pain EXAM: CT ABDOMEN AND PELVIS WITHOUT CONTRAST TECHNIQUE: Multidetector CT imaging of the abdomen and pelvis was performed following the standard protocol without IV contrast. RADIATION DOSE REDUCTION: This exam was performed according to the  departmental dose-optimization program which includes automated exposure control, adjustment of the mA and/or kV according to patient size and/or use of iterative reconstruction technique. COMPARISON:  CT abdomen and pelvis 01/02/2018. FINDINGS: Lower chest: No acute abnormality. Hepatobiliary: No focal liver abnormality is seen. No gallstones, gallbladder wall thickening, or biliary dilatation. Pancreas: Unremarkable. No pancreatic ductal dilatation or surrounding inflammatory changes. Spleen: Normal in size without focal abnormality. Adrenals/Urinary Tract: There is a 3 mm calculus in the bladder at the level of the left ureterovesicular junction. There is no hydronephrosis. There are additional punctate calculi in the right kidney. There is no perinephric fat stranding. The adrenal glands are within normal limits. Stomach/Bowel: Stomach is within normal limits. Appendix appears normal. No evidence of bowel wall thickening, distention, or inflammatory changes. Vascular/Lymphatic: No significant vascular findings are present. No enlarged abdominal or pelvic lymph nodes. There are nonenlarged right lower quadrant mesenteric lymph nodes. Reproductive: There is an IUD in the uterus. Adnexa are within normal limits. Other: No  abdominal wall hernia or abnormality. No abdominopelvic ascites. Musculoskeletal: No acute or significant osseous findings. IMPRESSION: 1. 3 mm calculus in the bladder at the level of the left ureterovesicular junction. No hydronephrosis. 2. Additional punctate nonobstructing right renal calculi. Electronically Signed   By: Greig Pique M.D.   On: 11/25/2023 22:05     Procedures   Medications Ordered in the ED  ondansetron  (ZOFRAN -ODT) disintegrating tablet 4 mg (4 mg Oral Given 11/25/23 2121)  ketorolac (TORADOL) 15 MG/ML injection 15 mg (15 mg Intravenous Given 11/25/23 47106)    33 year old here for evaluation of left-sided abdominal pain radiating to suprapubic region.  Has IUD.  No  concerns for pregnancy.  Not sexually active no concern for STDs.  Has noted some hematuria however no frequency, urgency.  Does feel like she has been having bladder spasms does not feel like her typical UTI.  Pain radiated from left to lower region.  No personal history of kidney stones however does have family history of kidney stones.  Will plan on labs, imaging, reassess  Labs and imaging personally viewed interpreted CBC leukocytosis 11.2 Metabolic panel without significant abnormality Lipase 29 UA negative for infection does show trace blood Pregnancy test negative Ultrasound negative for torsion, IUD in appropriate position CT scan shows 3 mm kidney stone in bladder at the ureterovesicular junction  Discussed results with patient.  Pain control.  Tolerating p.o. intake.  Will have her follow-up outpatient, return for new or worsening symptoms.    On repeat exam patient does not have a surgical abdomin and there are no peritoneal signs.  No indication of appendicitis, bowel obstruction, bowel perforation, cholecystitis, diverticulitis, PID, intermittent, persistent torsion, TOA, ectopic pregnancy, AAA, dissection, traumatic injury, infected kidney stone, GI bleed.  Patient discharged home with symptomatic treatment and given strict instructions for follow-up with their primary care physician.  I have also discussed reasons to return immediately to the ER.  Patient expresses understanding and agrees with plan.                                   Medical Decision Making Amount and/or Complexity of Data Reviewed External Data Reviewed: labs, radiology and notes. Labs: ordered. Decision-making details documented in ED Course. Radiology: ordered and independent interpretation performed. Decision-making details documented in ED Course.  Risk OTC drugs. Prescription drug management. Decision regarding hospitalization. Diagnosis or treatment significantly limited by social determinants of  health.       Final diagnoses:  Nephrolithiasis    ED Discharge Orders          Ordered    oxyCODONE-acetaminophen  (PERCOCET/ROXICET) 5-325 MG tablet  Every 6 hours PRN        11/25/23 2251    ondansetron  (ZOFRAN -ODT) 4 MG disintegrating tablet  Every 8 hours PRN        11/25/23 2251               Kenecia Barren A, PA-C 11/25/23 2255    Jerrol Agent, MD 11/26/23 [phone]

## 2023-11-25 NOTE — Discharge Instructions (Addendum)
 It was a pleasure taking care of you here today  As we discussed in the room you have a 3 mm kidney stone in your bladder that is likely why you are having pain on the left and suprapubic region.  I written you for a few medications to help. Make sure to follow-up outpatient with urology your primary care provider  Percocet-this is an opiate pain medication.  Return if you develop severe, worsening pain, fever, persistent nausea and vomiting
